# Patient Record
Sex: Female | Born: 1985 | Race: White | Hispanic: No | Marital: Single | Smoking: Never smoker
Health system: Southern US, Community
[De-identification: ages and names within clinical notes are randomized; demographics above are authoritative.]

## PROBLEM LIST (undated history)

## (undated) DIAGNOSIS — T7840XA Allergy, unspecified, initial encounter: Secondary | ICD-10-CM

## (undated) DIAGNOSIS — L309 Dermatitis, unspecified: Secondary | ICD-10-CM

## (undated) DIAGNOSIS — E079 Disorder of thyroid, unspecified: Secondary | ICD-10-CM

## (undated) HISTORY — PX: MANDIBLE SURGERY: SHX707

## (undated) HISTORY — DX: Allergy, unspecified, initial encounter: T78.40XA

---

## 2010-07-10 LAB — HM PAP SMEAR: HM Pap smear: NORMAL

## 2011-02-18 ENCOUNTER — Ambulatory Visit (INDEPENDENT_AMBULATORY_CARE_PROVIDER_SITE_OTHER): Payer: 59 | Admitting: Family

## 2011-02-18 ENCOUNTER — Encounter: Payer: Self-pay | Admitting: Family

## 2011-02-18 VITALS — BP 90/64 | HR 66 | Temp 98.3°F | Resp 12 | Ht 63.0 in | Wt 106.1 lb

## 2011-02-18 DIAGNOSIS — B081 Molluscum contagiosum: Secondary | ICD-10-CM

## 2011-02-18 DIAGNOSIS — K602 Anal fissure, unspecified: Secondary | ICD-10-CM

## 2011-02-18 MED ORDER — IMIQUIMOD 5 % EX CREA: TOPICAL_CREAM | CUTANEOUS | Status: AC

## 2011-02-18 NOTE — Progress Notes (Signed)
  Subjective:    Patient ID: Tiffany Payne, female    DOB: July 14, 1986, 25 y.o.   MRN: [id number]  HPI  New to establish care.  1) thigh rash- Boyfriend had moluscum contagiosium.  First noticed lesions about 2 months ago.  Denies associated itching.    2) Rectal bleeding- notes 1 month hx of painful bowel movements and associated blood on toilet tissue. She has been taking colace at night which has helped some.  Pain is worst at beginning of BM.    3)Seasonal allergies- using Zyrtec.  Throat gets itchy, occasional sinus pressure but none for a few weeks.   Review of Systems  Constitutional: Negative for fever and chills.  HENT: Negative for hearing loss.   Eyes: Negative for visual disturbance.  Respiratory: Negative for cough and shortness of breath.   Cardiovascular: Negative for chest pain and palpitations.  Gastrointestinal: Positive for blood in stool. Negative for constipation and abdominal distention.  Genitourinary: Negative for dysuria and hematuria.  Musculoskeletal: Negative for joint swelling and arthralgias.  Skin:       See HPI  Neurological: Negative for weakness and numbness.  Hematological: Negative for adenopathy.  Psychiatric/Behavioral:       Denies depression or anxiety       Objective:   Physical Exam  Constitutional: She appears well-developed and well-nourished.  HENT:  Head: Normocephalic and atraumatic.  Neck: Neck supple.  Cardiovascular: Normal rate and regular rhythm.   Pulmonary/Chest: Effort normal and breath sounds normal.  Abdominal:       No external or internal palpable hemorrhoids.   ? Rectal fissure on posterior wall of rectum.  Skin:       Multiple small warts noted on upper thighs and buttocks, none noted on labia.          Assessment & Plan:

## 2011-02-18 NOTE — Assessment & Plan Note (Signed)
Will plan treatment with aldara.  If no improvement, will freeze next visit.

## 2011-02-18 NOTE — Patient Instructions (Signed)
Use lidocaine/hydrocortisone cream 2x daily to rectal area for 2 weeks.  Call if you develop recurrent rectal bleeding or increased pain. Follow up in 1 month.

## 2011-02-18 NOTE — Assessment & Plan Note (Signed)
Symptoms and exam are most consistent with rectal fissure.  Will plan to treat with lidocaine/hydrocortisone ointment bid #1 with 1 refill- rx handwritten and provided to pt as it was not on our list.

## 2011-03-25 ENCOUNTER — Ambulatory Visit: Payer: 59 | Admitting: Family

## 2011-12-14 ENCOUNTER — Other Ambulatory Visit (HOSPITAL_COMMUNITY)
Admission: RE | Admit: 2011-12-14 | Discharge: 2011-12-14 | Disposition: A | Discharge status: Home / Self Care | Payer: 59 | Source: Ambulatory Visit | Attending: Family Medicine | Admitting: Family Medicine

## 2011-12-14 DIAGNOSIS — Z113 Encounter for screening for infections with a predominantly sexual mode of transmission: Secondary | ICD-10-CM | POA: Insufficient documentation

## 2011-12-14 DIAGNOSIS — Z0000 Encounter for general adult medical examination without abnormal findings: Secondary | ICD-10-CM | POA: Insufficient documentation

## 2012-12-14 ENCOUNTER — Other Ambulatory Visit (HOSPITAL_COMMUNITY)
Admission: RE | Admit: 2012-12-14 | Discharge: 2012-12-14 | Disposition: A | Discharge status: Home / Self Care | Payer: 59 | Source: Ambulatory Visit | Attending: Physician Assistant | Admitting: Physician Assistant

## 2012-12-14 DIAGNOSIS — Z0000 Encounter for general adult medical examination without abnormal findings: Secondary | ICD-10-CM | POA: Insufficient documentation

## 2012-12-14 DIAGNOSIS — Z113 Encounter for screening for infections with a predominantly sexual mode of transmission: Secondary | ICD-10-CM | POA: Insufficient documentation

## 2020-08-31 ENCOUNTER — Other Ambulatory Visit: Payer: Self-pay

## 2020-08-31 ENCOUNTER — Encounter (HOSPITAL_COMMUNITY): Payer: Self-pay

## 2020-08-31 ENCOUNTER — Ambulatory Visit (HOSPITAL_COMMUNITY)
Admission: EM | Admit: 2020-08-31 | Discharge: 2020-08-31 | Disposition: A | Discharge status: Home / Self Care | Payer: PRIVATE HEALTH INSURANCE | Attending: Family Medicine | Admitting: Family Medicine

## 2020-08-31 ENCOUNTER — Ambulatory Visit (INDEPENDENT_AMBULATORY_CARE_PROVIDER_SITE_OTHER): Payer: PRIVATE HEALTH INSURANCE

## 2020-08-31 DIAGNOSIS — S6702XA Crushing injury of left thumb, initial encounter: Secondary | ICD-10-CM | POA: Diagnosis not present

## 2020-08-31 DIAGNOSIS — S62522A Displaced fracture of distal phalanx of left thumb, initial encounter for closed fracture: Secondary | ICD-10-CM

## 2020-08-31 DIAGNOSIS — M79645 Pain in left finger(s): Secondary | ICD-10-CM

## 2020-08-31 HISTORY — DX: Disorder of thyroid, unspecified: E07.9

## 2020-08-31 HISTORY — DX: Dermatitis, unspecified: L30.9

## 2020-08-31 MED ORDER — ONDANSETRON 4 MG PO TBDP: 4.0000 mg | ORAL_TABLET | Freq: Three times a day (TID) | ORAL | 0 refills | Status: AC | PRN

## 2020-08-31 MED ORDER — HYDROCODONE-ACETAMINOPHEN 5-325 MG PO TABS: 1.0000 | ORAL_TABLET | Freq: Four times a day (QID) | ORAL | 0 refills | Status: DC | PRN

## 2020-08-31 MED ORDER — AMOXICILLIN-POT CLAVULANATE 875-125 MG PO TABS: 1.0000 | ORAL_TABLET | Freq: Two times a day (BID) | ORAL | 0 refills | Status: AC

## 2020-08-31 MED ORDER — HYDROCODONE-ACETAMINOPHEN 5-325 MG PO TABS
1.0000 | ORAL_TABLET | Freq: Four times a day (QID) | ORAL | 0 refills | Status: DC | PRN
Start: 2020-08-31 — End: 2020-08-31

## 2020-08-31 MED ORDER — ONDANSETRON 4 MG PO TBDP: 4.0000 mg | ORAL_TABLET | Freq: Three times a day (TID) | ORAL | 0 refills | Status: DC | PRN

## 2020-08-31 MED ORDER — AMOXICILLIN-POT CLAVULANATE 875-125 MG PO TABS: 1.0000 | ORAL_TABLET | Freq: Two times a day (BID) | ORAL | 0 refills | Status: DC

## 2020-08-31 MED ORDER — HYDROCODONE-ACETAMINOPHEN 5-325 MG PO TABS
1.0000 | ORAL_TABLET | Freq: Four times a day (QID) | ORAL | 0 refills | Status: AC | PRN
Start: 2020-08-31 — End: ?

## 2020-08-31 NOTE — ED Triage Notes (Signed)
Pt presents with left thumb injury after slamming it in car door this afternoon.

## 2020-08-31 NOTE — Discharge Instructions (Signed)

## 2020-09-02 NOTE — ED Provider Notes (Signed)
[facility]   [id number] 08/31/20 Arrival Time: 1421  ASSESSMENT & PLAN:  1. Thumb pain, left   2. Closed fracture of tuft of distal phalanx of left thumb     I have personally viewed the imaging studies ordered this visit. L thumb tuft fracture.  Placed in thumb spica.  Meds ordered this encounter  Medications  . amoxicillin-clavulanate (AUGMENTIN) 875-125 MG tablet    Sig: Take 1 tablet by mouth every 12 (twelve) hours.    Dispense:  14 tablet    Refill:  0  . HYDROcodone-acetaminophen (NORCO/VICODIN) 5-325 MG tablet    Sig: Take 1 tablet by mouth every 6 (six) hours as needed for moderate pain or severe pain.    Dispense:  12 tablet    Refill:  0  . ondansetron (ZOFRAN-ODT) 4 MG disintegrating tablet    Sig: Take 1 tablet (4 mg total) by mouth every 8 (eight) hours as needed for nausea or vomiting.    Dispense:  15 tablet    Refill:  0   Visiting here. Will schedule f/u with hand specialist when she returns home tomorrow.  Reviewed expectations re: course of current medical issues. Questions answered. Outlined signs and symptoms indicating need for more acute intervention. Patient verbalized understanding. After Visit Summary given.  SUBJECTIVE: History from: patient. Tiffany Payne is a 34 y.o. female who reports shutting L thumb in car door today; immediate and continuing pain. Mild swelling. No extremity sensation changes or weakness. Slight bleeding from nail. Has acrylic nails and unable to remove.   Past Surgical History:  Procedure Laterality Date  . MANDIBLE SURGERY        OBJECTIVE:  Vitals:   08/31/20 1516  BP: 117/83  Pulse: 99  Resp: 18  Temp: 98.4 F (36.9 C)  TempSrc: Oral  SpO2: 98%    General appearance: alert; no distress HEENT: [place]; AT Neck: supple with FROM Resp: unlabored respirations Extremities: . L thumb: warm with well perfused appearance; distal TTP; acrylic nail is cracked with a little oozing blood; slight  bruising over pad of thumb; normal distal ROM; normal distal sensation Neurologic: gait normal; normal sensation and strength of LUE Psychological: alert and cooperative; normal mood and affect  Imaging: DG Finger Thumb Left  Result Date: 08/31/2020 CLINICAL DATA:  Acute LEFT thumb injury today.  Initial encounter. EXAM: LEFT THUMB 2+V COMPARISON:  None. FINDINGS: A nondisplaced tuft fracture is present. No other fracture, subluxation or dislocation identified. No unexpected radiopaque foreign bodies are noted. IMPRESSION: Nondisplaced tuft fracture. Electronically Signed   By: Harmon Pier M.D.   On: 08/31/2020 16:20      No Known Allergies  Past Medical History:  Diagnosis Date  . Allergy   . Eczema   . Thyroid disease    Social History   Socioeconomic History  . Marital status: Single    Spouse name: Not on file  . Number of children: 0  . Years of education: Not on file  . Highest education level: Not on file  Occupational History    Employer: [employer]  Tobacco Use  . Smoking status: Never Smoker  . Smokeless tobacco: Never Used  Substance and Sexual Activity  . Alcohol use: No  . Drug use: No  . Sexual activity: Not on file  Other Topics Concern  . Not on file  Social History Narrative   Exercise:  3 x weekly   Caffeine: 1 daily         Social  Determinants of Health   Financial Resource Strain:   . Difficulty of Paying Living Expenses: Not on file  Food Insecurity:   . Worried About Programme researcher, broadcasting/film/video in the Last Year: Not on file  . Ran Out of Food in the Last Year: Not on file  Transportation Needs:   . Lack of Transportation (Medical): Not on file  . Lack of Transportation (Non-Medical): Not on file  Physical Activity:   . Days of Exercise per Week: Not on file  . Minutes of Exercise per Session: Not on file  Stress:   . Feeling of Stress : Not on file  Social Connections:   . Frequency of Communication with Friends and Family: Not on file  .  Frequency of Social Gatherings with Friends and Family: Not on file  . Attends Religious Services: Not on file  . Active Member of Clubs or Organizations: Not on file  . Attends Banker Meetings: Not on file  . Marital Status: Not on file   Family History  Problem Relation Age of Onset  . Hypertension Mother   . Heart disease Father   . Anemia Maternal Grandmother   . Heart disease Maternal Grandfather   . Diabetes Maternal Grandfather   . Heart disease Paternal Grandfather   . Diabetes Paternal Grandfather    Past Surgical History:  Procedure Laterality Date  . MANDIBLE SURGERY        Mardella Layman, MD 09/02/20 [phone]

## 2022-05-31 IMAGING — DX DG FINGER THUMB 2+V*L*
3 series · 3 of 3 positions shown · non-contrast
Comparison: None.

CLINICAL DATA: Acute LEFT thumb injury today.  Initial encounter.

EXAM:
LEFT THUMB 2+V

[finger ap]
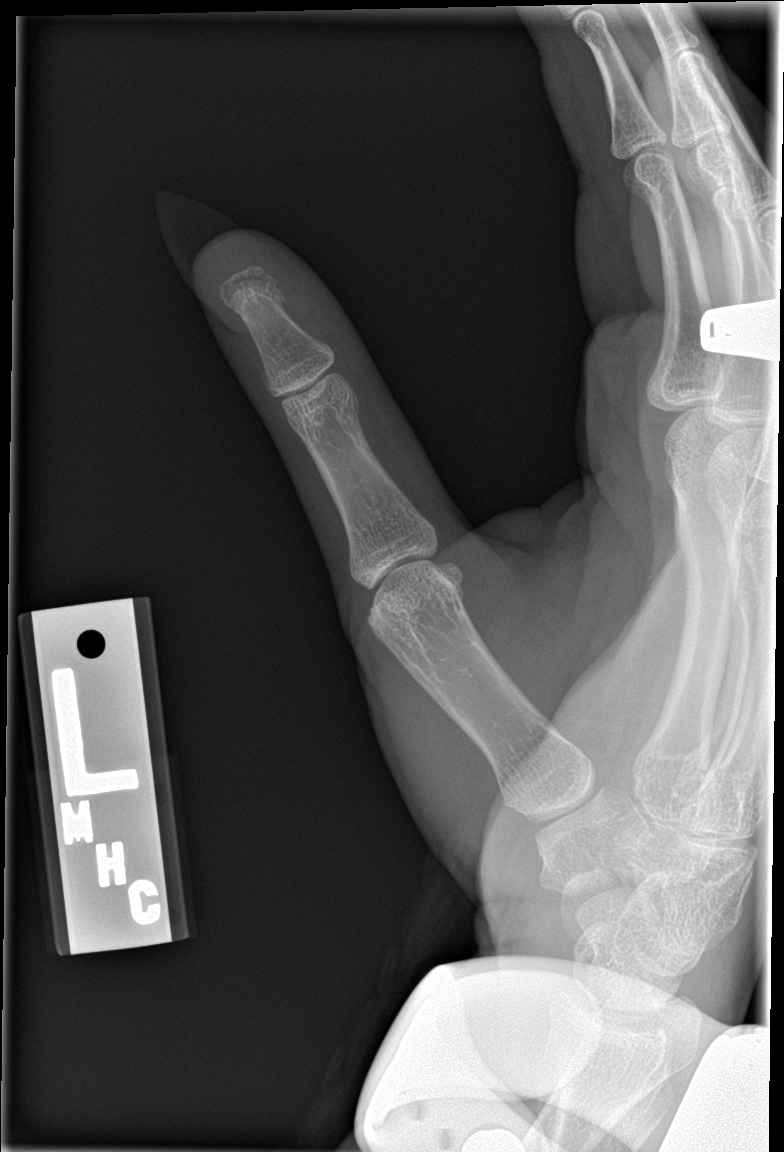

[finger obl]
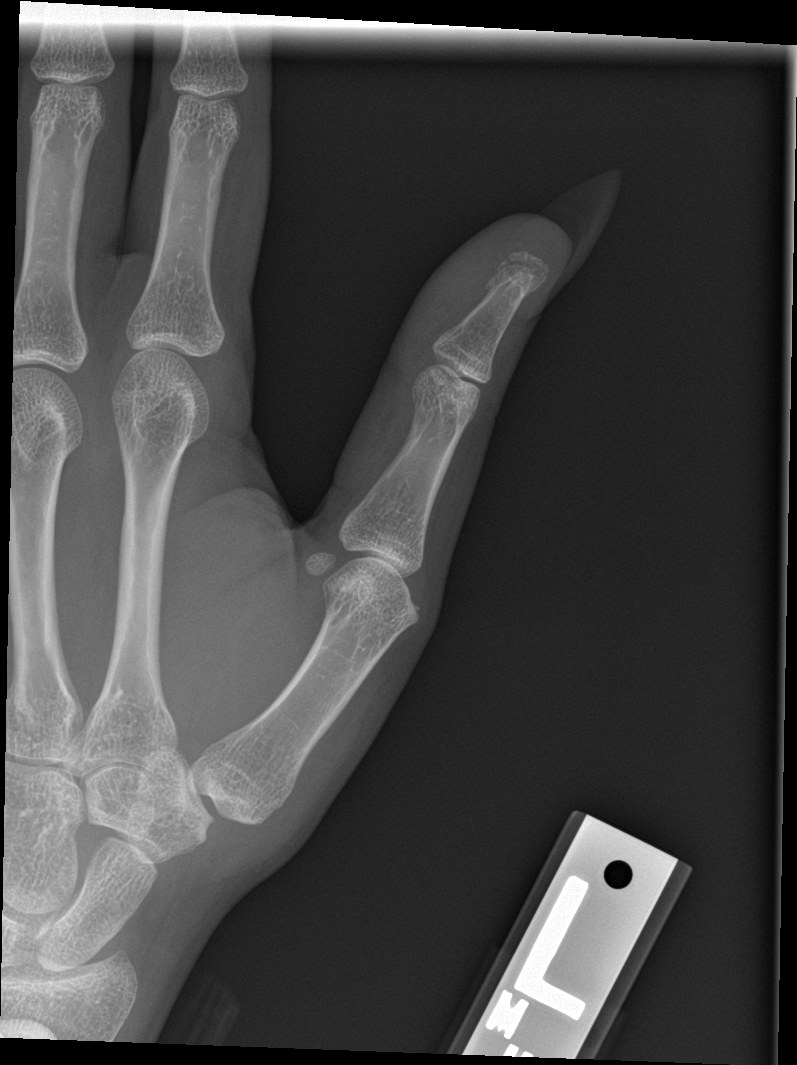

[finger lat]
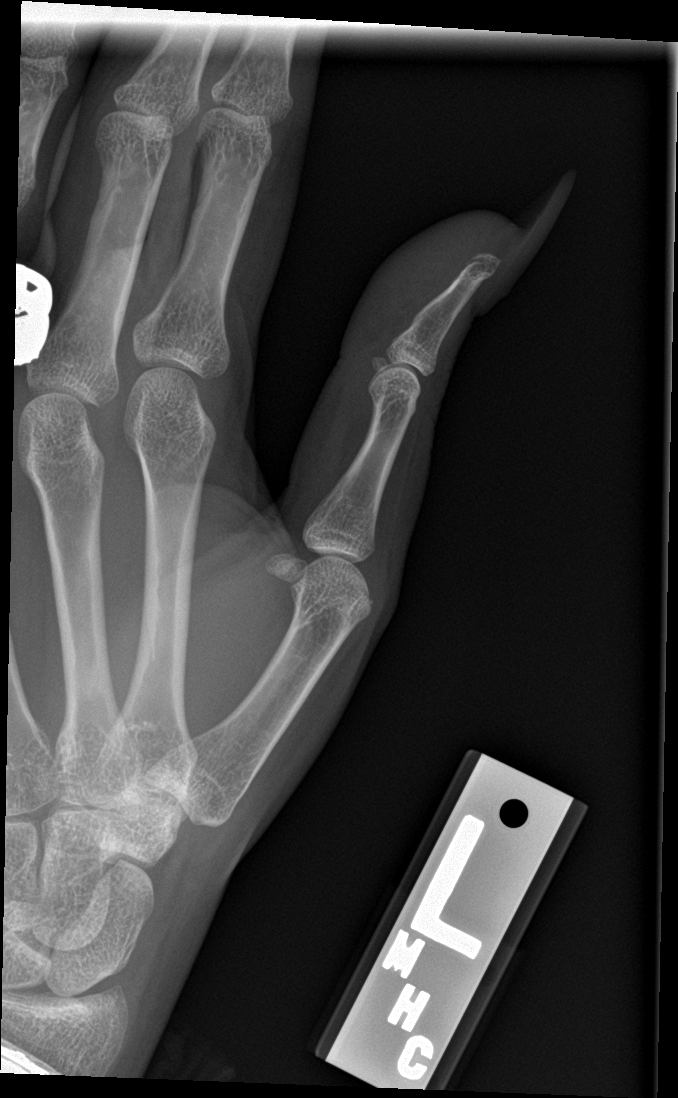

[3 of 3 positions shown; findings below may reference images not displayed]

FINDINGS: A nondisplaced tuft fracture is present.

No other fracture, subluxation or dislocation identified.

No unexpected radiopaque foreign bodies are noted.
IMPRESSION: Nondisplaced tuft fracture.
